# Patient Record
Sex: Male | Born: 1995 | Race: Black or African American | Hispanic: No | Marital: Single | Smoking: Never smoker
Health system: Southern US, Community
[De-identification: ages and names within clinical notes are randomized; demographics above are authoritative.]

---
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
## 2016-03-05 IMAGING — CR DG FOOT COMPLETE 3+V*R*
3 series · 3 of 3 positions shown · non-contrast
Comparison: 12/03/2007

CLINICAL DATA: The patient was jumping up and down. Landed wrong.
Pain and swelling in the right foot and ankle.

EXAM:
RIGHT FOOT COMPLETE - 3+ VIEW

[x foot ap right]
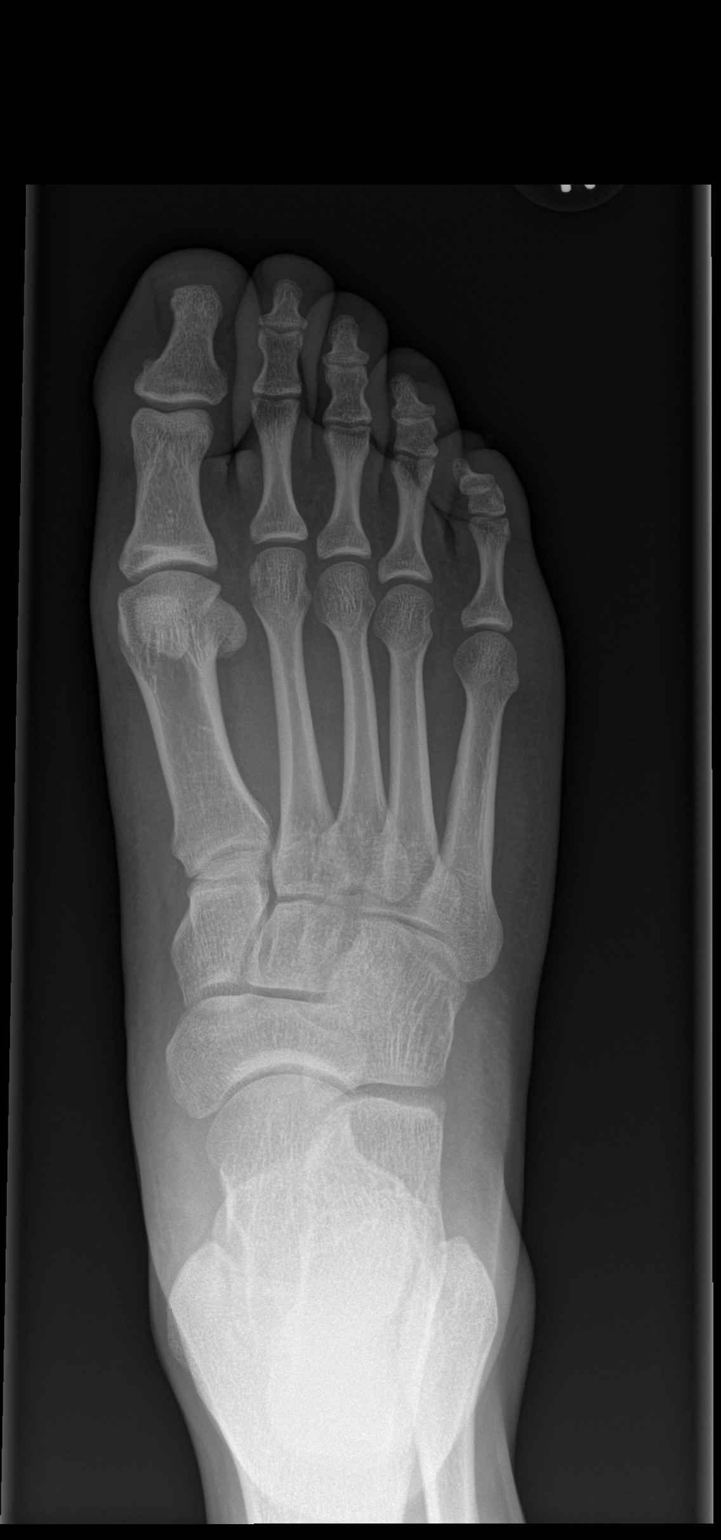

[x foot obl right]
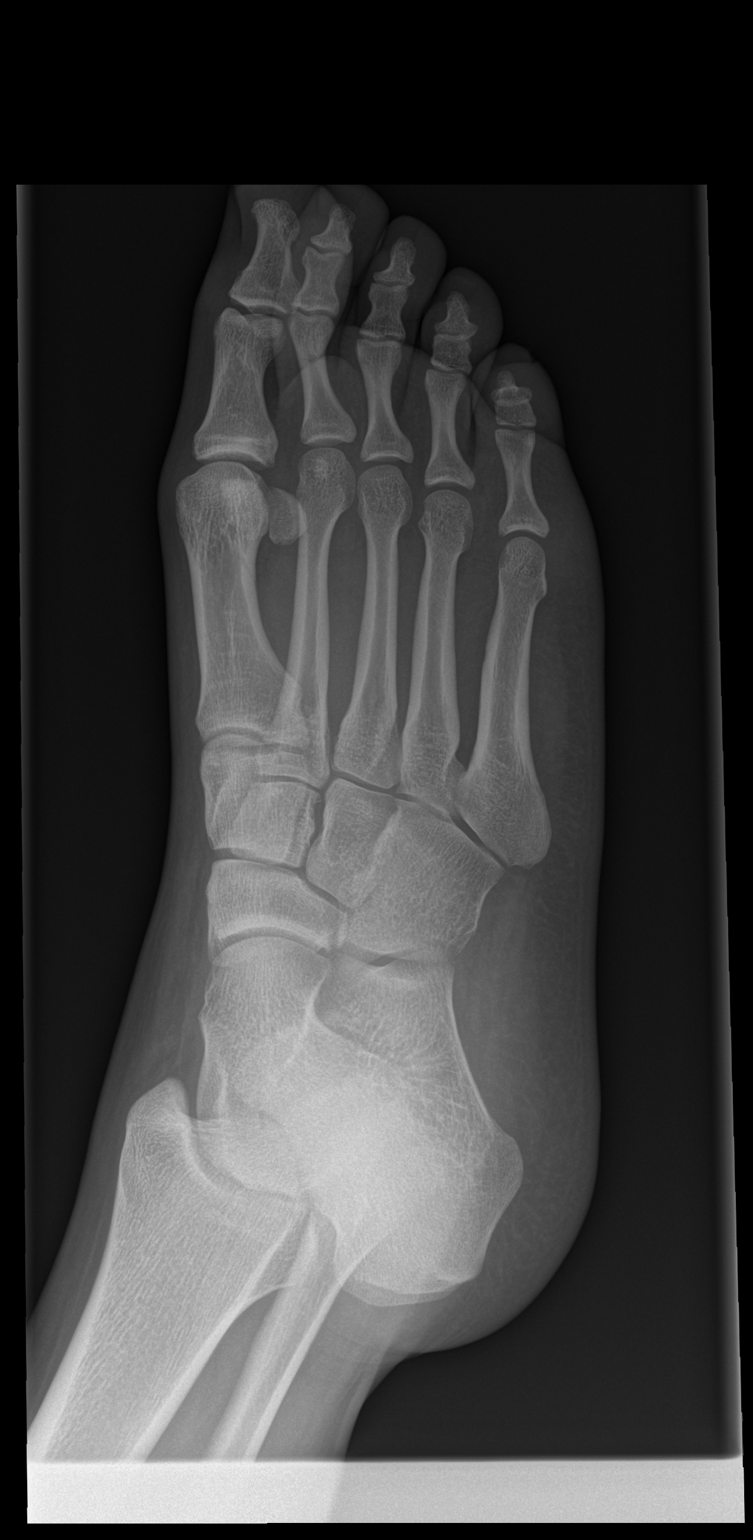

[x foot lat right]
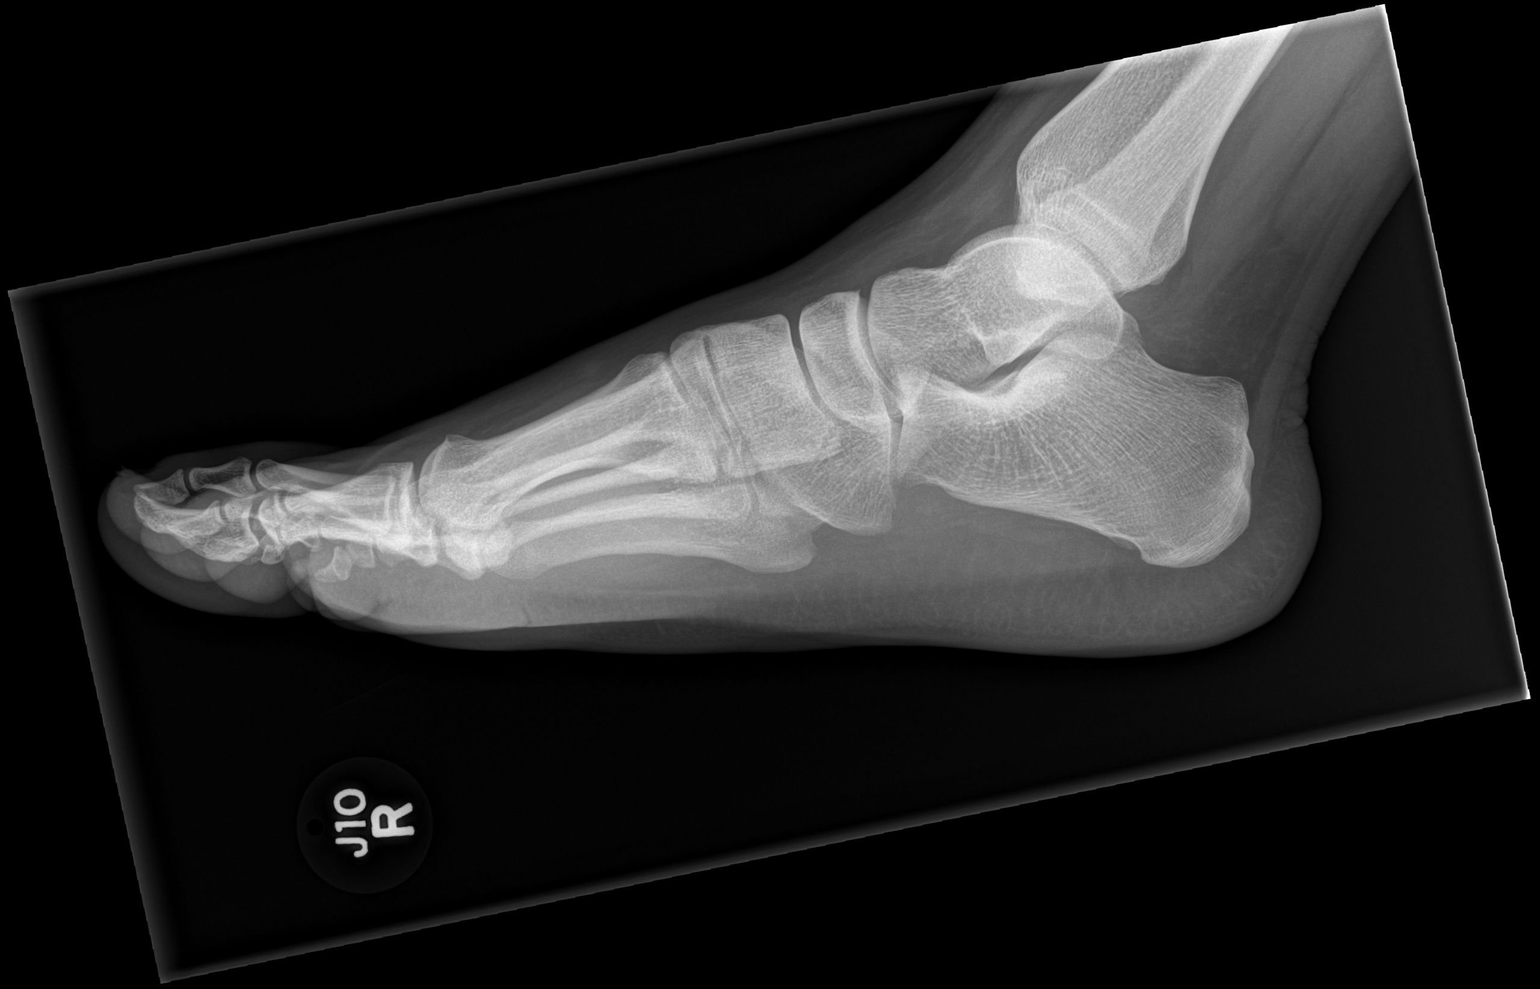

[3 of 3 positions shown; findings below may reference images not displayed]

FINDINGS: There is no evidence of fracture or dislocation. There is no
evidence of arthropathy or other focal bone abnormality. Soft
tissues are unremarkable.
IMPRESSION: Negative.

## 2016-04-16 ENCOUNTER — Encounter (HOSPITAL_COMMUNITY): Payer: Self-pay | Admitting: Emergency Medicine

## 2016-04-16 ENCOUNTER — Emergency Department (HOSPITAL_COMMUNITY): Payer: Medicaid Other

## 2016-04-16 ENCOUNTER — Emergency Department (HOSPITAL_COMMUNITY)
Admission: EM | Admit: 2016-04-16 | Discharge: 2016-04-16 | Disposition: A | Discharge status: Home / Self Care | Payer: Medicaid Other | Attending: Emergency Medicine | Admitting: Emergency Medicine

## 2016-04-16 DIAGNOSIS — R072 Precordial pain: Secondary | ICD-10-CM | POA: Insufficient documentation

## 2016-04-16 DIAGNOSIS — R079 Chest pain, unspecified: Secondary | ICD-10-CM

## 2016-04-16 LAB — CBC
HCT: 46 % (ref 39.0–52.0)
Hemoglobin: 15.9 g/dL (ref 13.0–17.0)
MCH: 30.1 pg (ref 26.0–34.0)
MCHC: 34.6 g/dL (ref 30.0–36.0)
MCV: 87 fL (ref 78.0–100.0)
PLATELETS: 235 10*3/uL (ref 150–400)
RBC: 5.29 MIL/uL (ref 4.22–5.81)
RDW: 12.8 % (ref 11.5–15.5)
WBC: 11.1 10*3/uL — ABNORMAL HIGH (ref 4.0–10.5)

## 2016-04-16 LAB — BASIC METABOLIC PANEL
Anion gap: 5 (ref 5–15)
BUN: 12 mg/dL (ref 6–20)
CALCIUM: 9.4 mg/dL (ref 8.9–10.3)
CO2: 31 mmol/L (ref 22–32)
CREATININE: 0.91 mg/dL (ref 0.61–1.24)
Chloride: 103 mmol/L (ref 101–111)
GFR calc Af Amer: >60 mL/min (ref >60)
GFR calc non Af Amer: >60 mL/min (ref >60)
GLUCOSE: 98 mg/dL (ref 65–99)
Potassium: 4.4 mmol/L (ref 3.5–5.1)
Sodium: 139 mmol/L (ref 135–145)

## 2016-04-16 LAB — TROPONIN I

## 2016-04-16 MED ORDER — IBUPROFEN 800 MG PO TABS: 800.0000 mg | ORAL_TABLET | Freq: Three times a day (TID) | ORAL | 0 refills | Status: DC

## 2016-04-16 NOTE — ED Provider Notes (Signed)
[facility] DEPT Provider Note   CSN: [id number] Arrival date & time: 04/16/16  1335     History   Chief Complaint Chief Complaint  Patient presents with  . Chest Pain    HPI Kevin Curtis is a 21 y.o. male.   Chest Pain   This is a new problem. The current episode started more than 2 days ago. The problem occurs daily. The pain is present in the substernal region. The quality of the pain is described as brief. He has tried rest for the symptoms. The treatment provided mild relief. There are no known risk factors.  Pertinent negatives for past medical history include no aneurysm, no anxiety/panic attacks, no aortic dissection, no congenital heart disease, no connective tissue disease, no COPD, no CHF, no diabetes, no DVT, no MI, no PE and no recent injury.    History reviewed. No pertinent past medical history.  There are no active problems to display for this patient.   History reviewed. No pertinent surgical history.     Home Medications    Prior to Admission medications   Medication Sig Start Date End Date Taking? Authorizing Provider  ibuprofen (ADVIL,MOTRIN) 800 MG tablet Take 1 tablet (800 mg total) by mouth 3 (three) times daily. 04/16/16   Marily MemosJason Shekita Boyden, MD    Family History History reviewed. No pertinent family history.  Social History Social History  Substance Use Topics  . Smoking status: Never Smoker  . Smokeless tobacco: Not on file  . Alcohol use No     Allergies   Patient has no known allergies.   Review of Systems Review of Systems  Cardiovascular: Positive for chest pain.  All other systems reviewed and are negative.    Physical Exam Updated Vital Signs BP 134/68 (BP Location: Left Arm)   Pulse (!) 54   Temp 98.4 F (36.9 C) (Oral)   Resp 18   Ht 5\' 9"  (1.753 m)   Wt 180 lb (81.6 kg)   SpO2 100%   BMI 26.58 kg/m   Physical Exam  Constitutional: He appears well-developed and well-nourished.  HENT:  Head:  Normocephalic and atraumatic.  Eyes: Conjunctivae and EOM are normal.  Neck: Normal range of motion.  Cardiovascular: Normal rate.   Pulmonary/Chest: Effort normal. No respiratory distress. He exhibits no tenderness.  Abdominal: Soft. He exhibits no distension.  Musculoskeletal: Normal range of motion.  Neurological: He is alert. No cranial nerve deficit. Coordination normal.  Skin: Skin is warm and dry.  Nursing note and vitals reviewed.    ED Treatments / Results  Labs (all labs ordered are listed, but only abnormal results are displayed) Labs Reviewed  CBC - Abnormal; Notable for the following:       Result Value   WBC 11.1 (*)    All other components within normal limits  BASIC METABOLIC PANEL  TROPONIN I    EKG  EKG Interpretation  Date/Time:  Tuesday April 16 2016 13:43:09 EST Ventricular Rate:  67 PR Interval:  144 QRS Duration: 86 QT Interval:  392 QTC Calculation: 414 R Axis:   89 Text Interpretation:  Normal sinus rhythm Early repolarization Normal ECG No old tracing to compare Confirmed by [facility] MD, Barbara CowerJASON [phone]([id number]) on 04/16/2016 4:17:50 PM       Radiology Dg Chest 2 View  Result Date: 04/16/2016 CLINICAL DATA:  21 year old male with central chest pain for 3 days. Initial encounter. EXAM: CHEST  2 VIEW COMPARISON:  01/19/2011 and earlier. FINDINGS: Normal cardiac size and  mediastinal contours. Lung volumes compatible with good inspiratory effort. The lungs are clear. No pneumothorax or pleural effusion. Negative visible bowel gas pattern. No acute osseous abnormality identified. IMPRESSION: Negative.  No acute cardiopulmonary abnormality. Electronically Signed   By: Odessa Fleming M.D.   On: 04/16/2016 14:08    Procedures Procedures (including critical care time)  Medications Ordered in ED Medications - No data to display   Initial Impression / Assessment and Plan / ED Course  I have reviewed the triage vital signs and the nursing notes.  Pertinent labs &  imaging results that were available during my care of the patient were reviewed by me and considered in my medical decision making (see chart for details).  Clinical Course    Nonspecific chest pain, possibly MSK v anxiety related.  Troponin and ecg ok, sounds atypical, doubt cardiac.  PERC negative, doubt PE.  cxr and ecg ok, doubt PNA, PTX, dissection.  Will have follow up with a pcp to get [place] to ensure no e/o anatomic abnormalities.   Final Clinical Impressions(s) / ED Diagnoses   Final diagnoses:  Chest pain, unspecified type    New Prescriptions New Prescriptions   IBUPROFEN (ADVIL,MOTRIN) 800 MG TABLET    Take 1 tablet (800 mg total) by mouth 3 (three) times daily.     Marily Memos, MD 04/16/16 [phone]

## 2016-04-16 NOTE — ED Notes (Signed)
EKG given to Dr. Lockwood. 

## 2016-04-16 NOTE — Discharge Instructions (Signed)
Follow up with your PCP or use number/resource guide as needed to help obtain PCP. I would like for you to get an ultrasound of your heart to ensure no structural abnormalities.

## 2016-04-16 NOTE — ED Triage Notes (Signed)
Pt reports cp in center of chest without radiation x3 days.  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 Denies injury, has occasional sob.

## 2016-07-13 ENCOUNTER — Encounter (HOSPITAL_COMMUNITY): Payer: Self-pay | Admitting: *Deleted

## 2016-07-13 ENCOUNTER — Emergency Department (HOSPITAL_COMMUNITY)
Admission: EM | Admit: 2016-07-13 | Discharge: 2016-07-13 | Disposition: A | Discharge status: Home / Self Care | Payer: Medicaid Other | Attending: Emergency Medicine | Admitting: Emergency Medicine

## 2016-07-13 DIAGNOSIS — Z79899 Other long term (current) drug therapy: Secondary | ICD-10-CM | POA: Insufficient documentation

## 2016-07-13 DIAGNOSIS — J302 Other seasonal allergic rhinitis: Secondary | ICD-10-CM | POA: Insufficient documentation

## 2016-07-13 DIAGNOSIS — J301 Allergic rhinitis due to pollen: Secondary | ICD-10-CM

## 2016-07-13 MED ORDER — FLUTICASONE PROPIONATE 50 MCG/ACT NA SUSP: 2.0000 | Freq: Every day | NASAL | 0 refills | Status: AC

## 2016-07-13 NOTE — ED Notes (Signed)
ED Provider at bedside. 

## 2016-07-13 NOTE — ED Triage Notes (Signed)
Pt c/o congestion x 5 days unrelieved with OTC medications (zyrtec and vicks vapor rub)

## 2016-07-13 NOTE — Discharge Instructions (Signed)
Try using SALINE nasal sprays as well for your congestion. Use a humidifier at night

## 2016-07-13 NOTE — ED Provider Notes (Signed)
[facility]-EM[facility] DEPT Provider Note   CSN: [id number] Arrival date & time: 07/13/16  0149   By signing my name below, I, Kevin Curtis, attest that this documentation has been prepared under the direction and in the presence of Pricilla Loveless, MD. Electronically Signed: Soijett Curtis, ED Scribe. 07/13/16. 2:22 AM.  History   Chief Complaint Chief Complaint  Patient presents with  . Nasal Congestion    HPI Kevin Curtis is a 21 y.o. male who presents to the Emergency Department complaining of nasal congestion onset 4 days. Pt reports associated rhinorrhea x clear sputum, watery eyes, and sneezing. Pt has tried sudafed, zyrtec, and Vicks Vap-o-rub with no relief of his symptoms. He states that he has a hx of seasonal allergies due to pollen. He denies fever, cough, SOB, eye redness, ear pain, and any other symptoms. Denies having a PCP.    The history is provided by the patient and a significant other. No language interpreter was used.    History reviewed. No pertinent past medical history.  There are no active problems to display for this patient.   History reviewed. No pertinent surgical history.     Home Medications    Prior to Admission medications   Medication Sig Start Date End Date Taking? Authorizing Provider  fluticasone (FLONASE) 50 MCG/ACT nasal spray Place 2 sprays into both nostrils daily. 07/13/16   Pricilla Loveless, MD  ibuprofen (ADVIL,MOTRIN) 800 MG tablet Take 1 tablet (800 mg total) by mouth 3 (three) times daily. 04/16/16   Marily Memos, MD    Family History No family history on file.  Social History Social History  Substance Use Topics  . Smoking status: Never Smoker  . Smokeless tobacco: Never Used  . Alcohol use No     Allergies   Patient has no known allergies.   Review of Systems Review of Systems  Constitutional: Negative for fever.  HENT: Positive for congestion and sneezing. Negative for ear pain.   Eyes: Negative for redness.    +watery eyes  Respiratory: Negative for cough.   All other systems reviewed and are negative.    Physical Exam Updated Vital Signs BP (!) 143/74   Pulse 85   Temp 97.9 F (36.6 C) (Oral)   Resp 18   SpO2 98%   Physical Exam  Constitutional: He is oriented to person, place, and time. He appears well-developed and well-nourished.  HENT:  Head: Normocephalic and atraumatic.  Right Ear: External ear normal.  Left Ear: External ear normal.  Nose: Mucosal edema (mildly swollen turbinates) present. Right sinus exhibits no maxillary sinus tenderness and no frontal sinus tenderness. Left sinus exhibits no maxillary sinus tenderness and no frontal sinus tenderness.  Mouth/Throat: Oropharynx is clear and moist.  Eyes: Conjunctivae are normal. Right eye exhibits no discharge. Left eye exhibits no discharge.  Neck: Neck supple.  Cardiovascular: Normal rate, regular rhythm and normal heart sounds.   Pulmonary/Chest: Effort normal and breath sounds normal.  Abdominal: Soft. There is no tenderness.  Musculoskeletal: He exhibits no edema.  Neurological: He is alert and oriented to person, place, and time.  Skin: Skin is warm and dry.  Nursing note and vitals reviewed.    ED Treatments / Results  DIAGNOSTIC STUDIES: Oxygen Saturation is 98% on RA, nl by my interpretation.    COORDINATION OF CARE: 2:21 AM Discussed treatment plan with pt at bedside and pt agreed to plan.   Procedures Procedures (including critical care time)  Medications Ordered in ED Medications -  No data to display   Initial Impression / Assessment and Plan / ED Course  I have reviewed the triage vital signs and the nursing notes.  Patient appears to have allergic rhinitis. No signs or symptoms of infection. Is a recurrent issue for him. Will treat with Flonase, nasal saline sprays, and recommend humidifier in addition to the other meds he is trying. Follow-up with PCP. Discussed return precautions.  Final  Clinical Impressions(s) / ED Diagnoses   Final diagnoses:  Acute seasonal allergic rhinitis due to pollen    New Prescriptions Discharge Medication List as of 07/13/2016  2:25 AM    START taking these medications   Details  fluticasone (FLONASE) 50 MCG/ACT nasal spray Place 2 sprays into both nostrils daily., Starting Sat 07/13/2016, Print       I personally performed the services described in this documentation, which was scribed in my presence. The recorded information has been reviewed and is accurate.     Pricilla Loveless, MD 07/13/16 [phone]

## 2017-01-15 ENCOUNTER — Encounter (HOSPITAL_COMMUNITY): Payer: Self-pay | Admitting: *Deleted

## 2017-01-15 ENCOUNTER — Ambulatory Visit (INDEPENDENT_AMBULATORY_CARE_PROVIDER_SITE_OTHER): Payer: Self-pay

## 2017-01-15 ENCOUNTER — Ambulatory Visit (HOSPITAL_COMMUNITY)
Admission: EM | Admit: 2017-01-15 | Discharge: 2017-01-15 | Disposition: A | Discharge status: Home / Self Care | Payer: Self-pay | Attending: Family Medicine | Admitting: Family Medicine

## 2017-01-15 DIAGNOSIS — M25562 Pain in left knee: Secondary | ICD-10-CM

## 2017-01-15 MED ORDER — DICLOFENAC SODIUM 75 MG PO TBEC: 75.0000 mg | DELAYED_RELEASE_TABLET | Freq: Two times a day (BID) | ORAL | 0 refills | Status: AC

## 2017-01-15 NOTE — ED Triage Notes (Signed)
Patient intermittent left knee pain x 2 days. Denies swelling. Reports it feels swollen and has sharp pain. Ambulatory.

## 2017-01-18 NOTE — ED Provider Notes (Signed)
  [facility]   [id number] 01/15/17 Arrival Time: 1339  ASSESSMENT & PLAN:  1. Acute pain of left knee     Meds ordered this encounter  Medications  . diclofenac (VOLTAREN) 75 MG EC tablet    Sig: Take 1 tablet (75 mg total) by mouth 2 (two) times daily.    Dispense:  14 tablet    Refill:  0   Will f/u if not improving over the next few days. Reviewed expectations re: course of current medical issues. Questions answered. Outlined signs and symptoms indicating need for more acute intervention. Patient verbalized understanding. After Visit Summary given.   SUBJECTIVE:  Kevin Curtis is a 21 y.o. male who reports:  Knee Pain: Patient presents with knee pain involving the  left knee. Onset of the symptoms was two days ago, gradual. Inciting event: none known. Current symptoms include generalized discomfort described as sharp and transient. Pain is aggravated by going up and down stairs and walking.  Patient has had no prior knee problems. Evaluation to date: none. Treatment to date: none.  ROS: As per HPI.   OBJECTIVE:  Vitals:   01/15/17 1416  BP: 114/75  Pulse: 62  Resp: 16  Temp: 98 F (36.7 C)  TempSrc: Oral  SpO2: 100%    General appearance: alert; no distress Extremities: no cyanosis or edema; symmetrical with no gross deformities; no significant tenderness over his L knee with no swelling and no bruising; FROM; no instability CV: normal extremity capillary refill Skin: warm and dry Neurologic: normal gait; normal symmetric reflexes in all extremities; normal sensation Psychological: alert and cooperative; normal mood and affect  Imaging: Dg Knee Complete 4 Views Left  Result Date: 01/15/2017 CLINICAL DATA:  Injury.  Knee pain EXAM: LEFT KNEE - COMPLETE 4+ VIEW COMPARISON:  None. FINDINGS: No evidence of fracture, dislocation, or joint effusion. No evidence of arthropathy or other focal bone abnormality. Soft tissues are unremarkable.  IMPRESSION: Negative. Electronically Signed   By: Marlan Palauharles  Clark M.D.   On: 01/15/2017 15:12    No Known Allergies   Social History   Social History  . Marital status: Single    Spouse name: N/A  . Number of children: N/A  . Years of education: N/A   Occupational History  . Not on file.   Social History Main Topics  . Smoking status: Never Smoker  . Smokeless tobacco: Never Used  . Alcohol use No  . Drug use: Yes    Types: Marijuana  . Sexual activity: Not on file   Other Topics Concern  . Not on file   Social History Narrative  . No narrative on file      Mardella LaymanHagler, Derrien, MD 01/18/17 1215

## 2017-12-17 IMAGING — CR DG CHEST 2V
2 series · 2 of 2 positions shown · non-contrast
Comparison: 01/19/2011 and earlier.

CLINICAL DATA: 20-year-old male with central chest pain for 3 days.
Initial encounter.

EXAM:
CHEST  2 VIEW

[w chest pa]
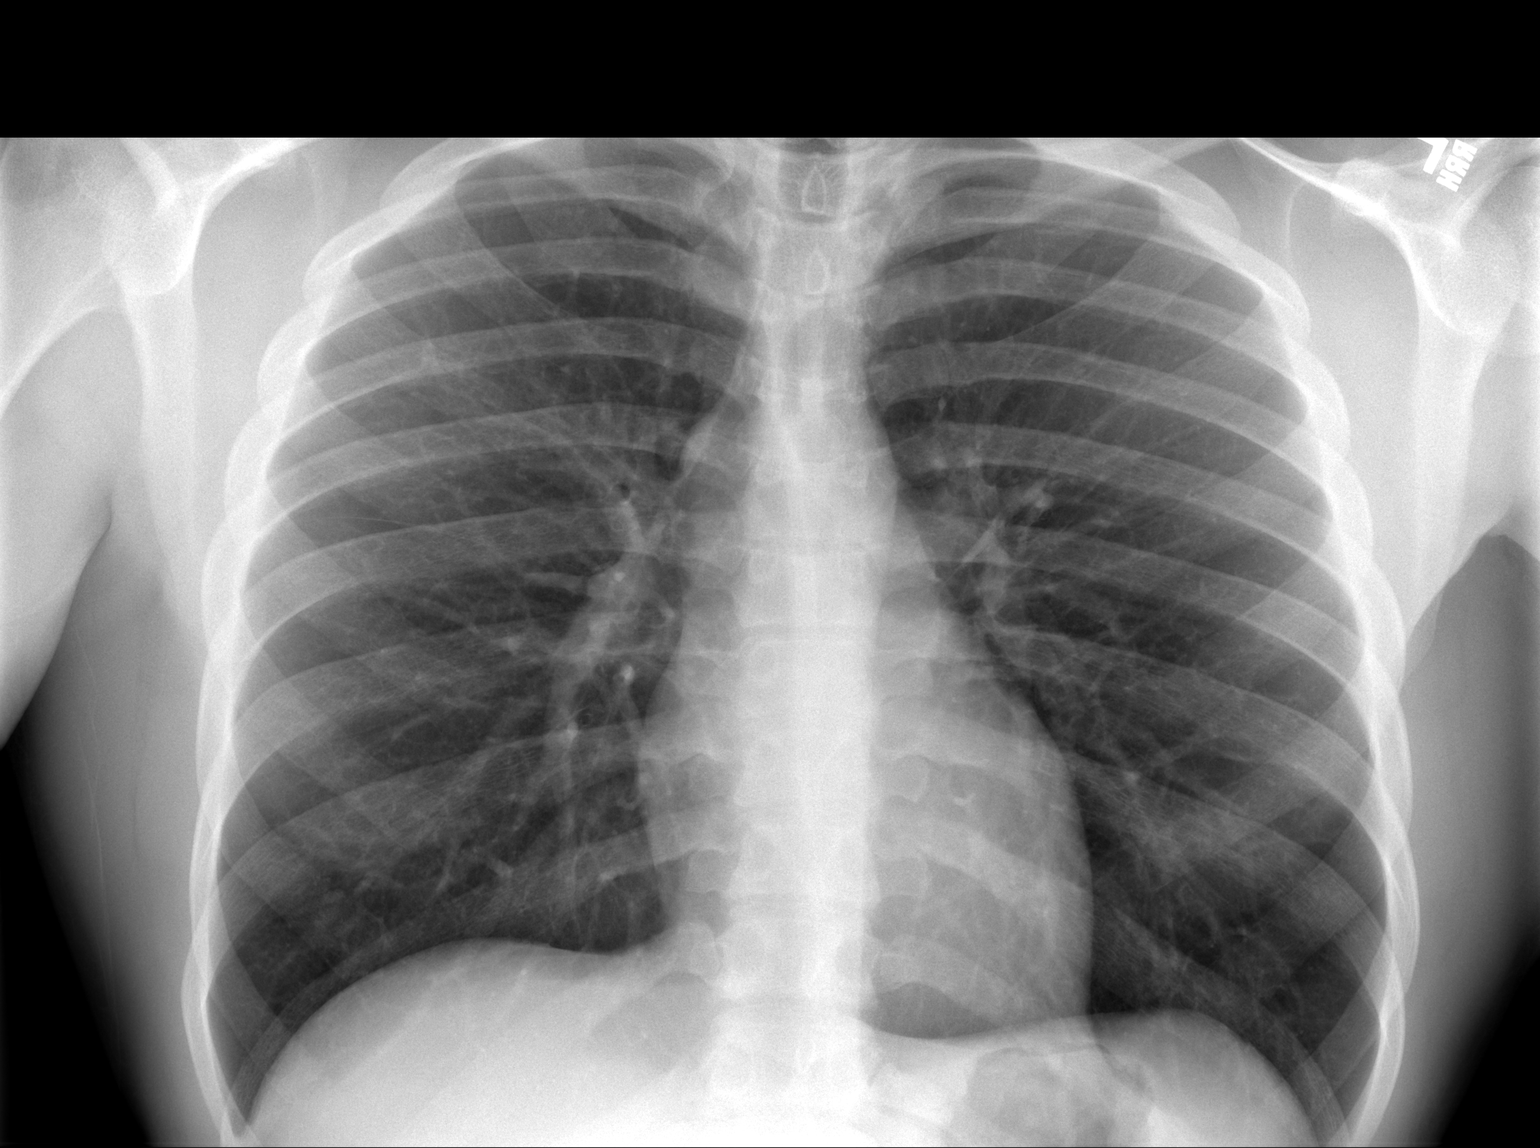

[w chest lat]
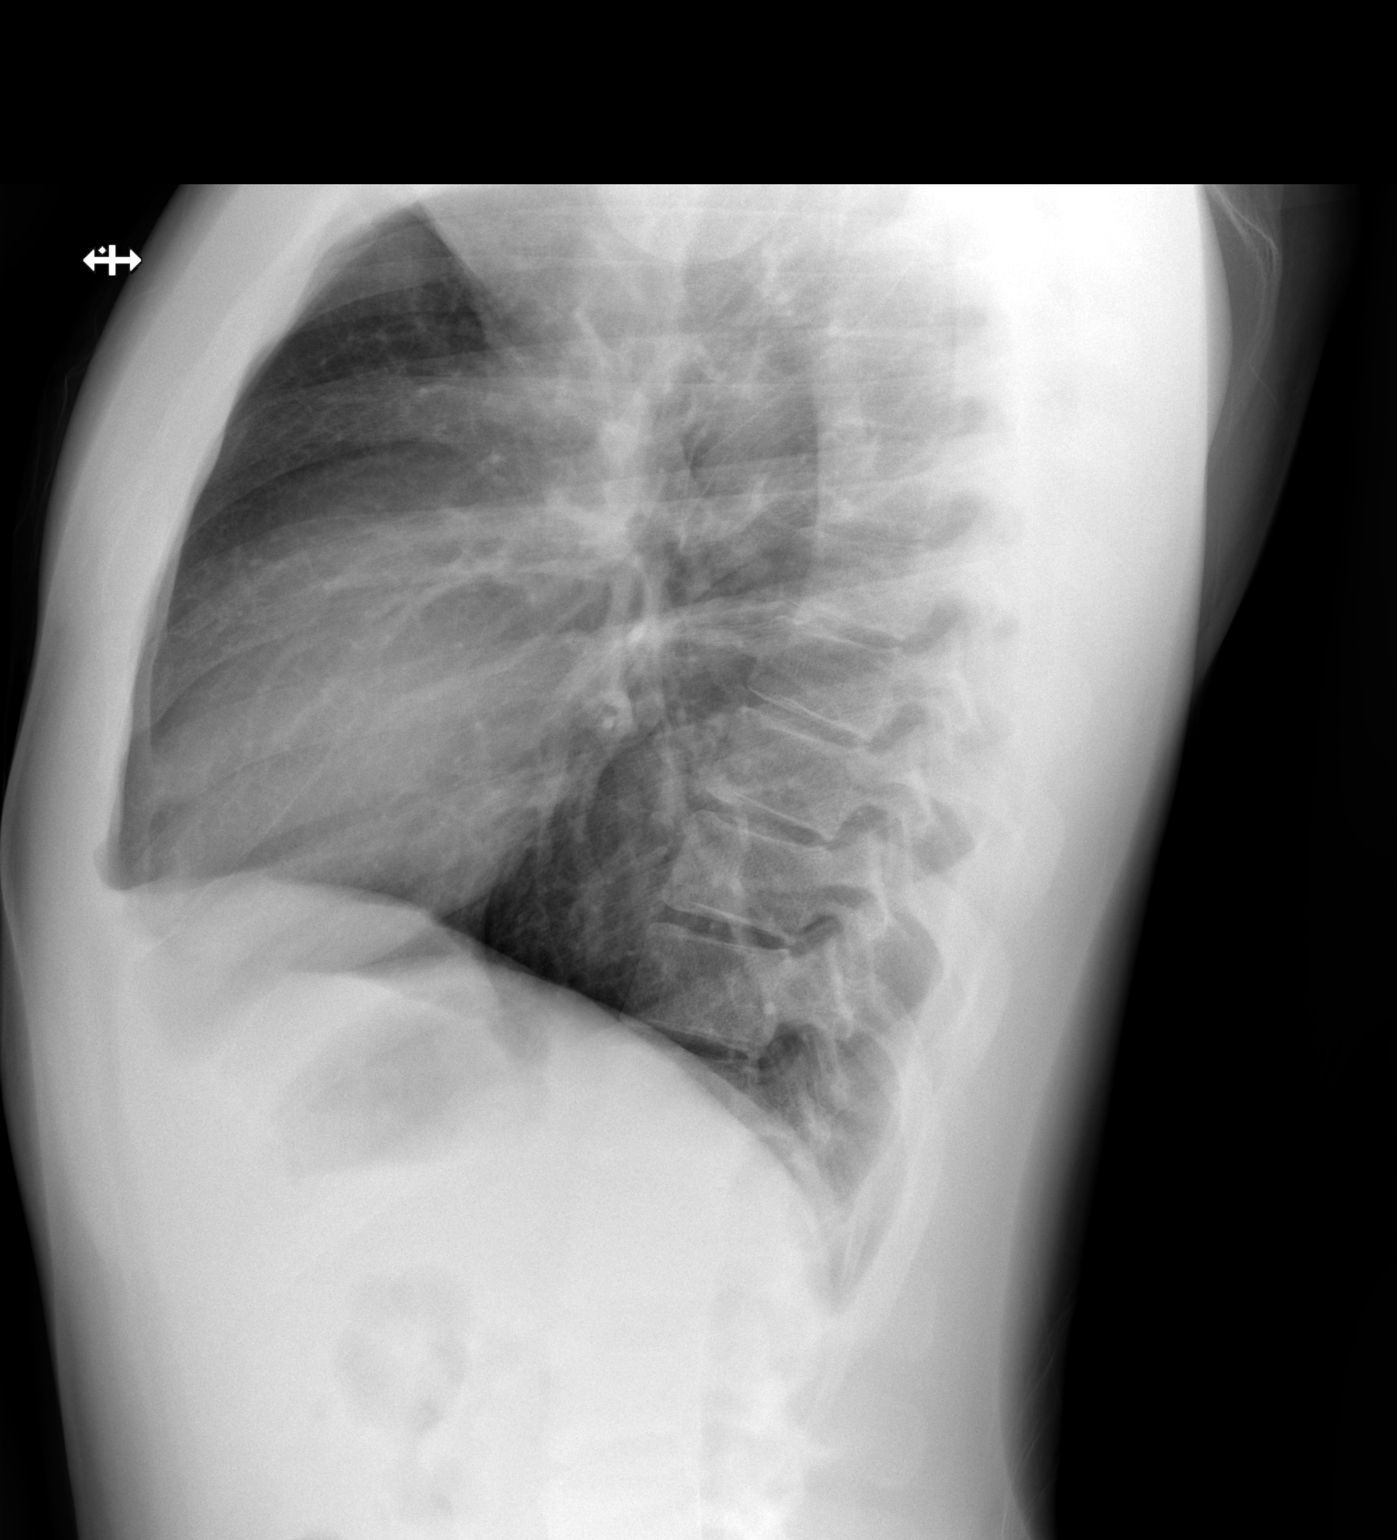

[2 of 2 positions shown; findings below may reference images not displayed]

FINDINGS: Normal cardiac size and mediastinal contours. Lung volumes
compatible with good inspiratory effort. The lungs are clear. No
pneumothorax or pleural effusion. Negative visible bowel gas
pattern. No acute osseous abnormality identified.
IMPRESSION: Negative.  No acute cardiopulmonary abnormality.

## 2021-03-21 ENCOUNTER — Ambulatory Visit: Payer: Self-pay | Admitting: Family Medicine
